# Patient Record
Sex: Male | Born: 1940 | Race: White | Hispanic: No | Marital: Married
Health system: Southern US, Community
[De-identification: ages and names within clinical notes are randomized; demographics above are authoritative.]

## PROBLEM LIST (undated history)

## (undated) DIAGNOSIS — R4702 Dysphasia: Secondary | ICD-10-CM

## (undated) DIAGNOSIS — I272 Pulmonary hypertension, unspecified: Secondary | ICD-10-CM

## (undated) DIAGNOSIS — J9601 Acute respiratory failure with hypoxia: Secondary | ICD-10-CM

## (undated) DIAGNOSIS — I639 Cerebral infarction, unspecified: Secondary | ICD-10-CM

## (undated) DIAGNOSIS — I2510 Atherosclerotic heart disease of native coronary artery without angina pectoris: Secondary | ICD-10-CM

## (undated) DIAGNOSIS — E119 Type 2 diabetes mellitus without complications: Secondary | ICD-10-CM

## (undated) DIAGNOSIS — D649 Anemia, unspecified: Secondary | ICD-10-CM

## (undated) DIAGNOSIS — I10 Essential (primary) hypertension: Secondary | ICD-10-CM

## (undated) DIAGNOSIS — I4891 Unspecified atrial fibrillation: Secondary | ICD-10-CM

## (undated) DIAGNOSIS — I739 Peripheral vascular disease, unspecified: Secondary | ICD-10-CM

## (undated) DIAGNOSIS — N289 Disorder of kidney and ureter, unspecified: Secondary | ICD-10-CM

## (undated) DIAGNOSIS — I509 Heart failure, unspecified: Secondary | ICD-10-CM

## (undated) HISTORY — PX: CORONARY ARTERY BYPASS GRAFT: SHX141

## (undated) HISTORY — PX: TRACHEOSTOMY: SUR1362

## (undated) HISTORY — PX: PEG PLACEMENT: SHX5437

---

## 2017-08-08 ENCOUNTER — Emergency Department (HOSPITAL_COMMUNITY): Payer: Medicare Other

## 2017-08-08 ENCOUNTER — Emergency Department (HOSPITAL_COMMUNITY)
Admission: EM | Admit: 2017-08-08 | Discharge: 2017-08-26 | Disposition: E | Payer: Medicare Other | Attending: Emergency Medicine | Admitting: Emergency Medicine

## 2017-08-08 DIAGNOSIS — I959 Hypotension, unspecified: Secondary | ICD-10-CM | POA: Diagnosis present

## 2017-08-08 DIAGNOSIS — A419 Sepsis, unspecified organism: Secondary | ICD-10-CM | POA: Diagnosis not present

## 2017-08-08 DIAGNOSIS — I10 Essential (primary) hypertension: Secondary | ICD-10-CM | POA: Insufficient documentation

## 2017-08-08 DIAGNOSIS — J189 Pneumonia, unspecified organism: Secondary | ICD-10-CM | POA: Diagnosis not present

## 2017-08-08 DIAGNOSIS — Z9911 Dependence on respirator [ventilator] status: Secondary | ICD-10-CM | POA: Diagnosis not present

## 2017-08-08 DIAGNOSIS — Z8673 Personal history of transient ischemic attack (TIA), and cerebral infarction without residual deficits: Secondary | ICD-10-CM | POA: Insufficient documentation

## 2017-08-08 DIAGNOSIS — E119 Type 2 diabetes mellitus without complications: Secondary | ICD-10-CM | POA: Diagnosis not present

## 2017-08-08 DIAGNOSIS — B999 Unspecified infectious disease: Secondary | ICD-10-CM

## 2017-08-08 HISTORY — DX: Dysphasia: R47.02

## 2017-08-08 HISTORY — DX: Anemia, unspecified: D64.9

## 2017-08-08 HISTORY — DX: Pulmonary hypertension, unspecified: I27.20

## 2017-08-08 HISTORY — DX: Atherosclerotic heart disease of native coronary artery without angina pectoris: I25.10

## 2017-08-08 HISTORY — DX: Type 2 diabetes mellitus without complications: E11.9

## 2017-08-08 HISTORY — DX: Disorder of kidney and ureter, unspecified: N28.9

## 2017-08-08 HISTORY — DX: Essential (primary) hypertension: I10

## 2017-08-08 HISTORY — DX: Heart failure, unspecified: I50.9

## 2017-08-08 HISTORY — DX: Acute respiratory failure with hypoxia: J96.01

## 2017-08-08 HISTORY — DX: Unspecified atrial fibrillation: I48.91

## 2017-08-08 HISTORY — DX: Peripheral vascular disease, unspecified: I73.9

## 2017-08-08 HISTORY — DX: Cerebral infarction, unspecified: I63.9

## 2017-08-08 LAB — CBG MONITORING, ED: GLUCOSE-CAPILLARY: 150 mg/dL — AB (ref 70–99)

## 2017-08-08 MED ORDER — SODIUM CHLORIDE 0.9 % IV BOLUS (SEPSIS): 1000.0000 mL | Freq: Once | INTRAVENOUS | Status: DC

## 2017-08-08 MED ORDER — SODIUM CHLORIDE 0.9 % IV BOLUS (SEPSIS)
1000.0000 mL | Freq: Once | INTRAVENOUS | Status: AC
  Administered 2017-08-08: 1000 mL via INTRAVENOUS

## 2017-08-08 MED ORDER — VANCOMYCIN HCL IN DEXTROSE 1-5 GM/200ML-% IV SOLN
1000.0000 mg | Freq: Once | INTRAVENOUS | Status: AC
  Administered 2017-08-09: 1000 mg via INTRAVENOUS
  Filled 2017-08-08: qty 200

## 2017-08-08 MED ORDER — SODIUM CHLORIDE 0.9 % IV BOLUS (SEPSIS)
1000.0000 mL | Freq: Once | INTRAVENOUS | Status: AC
Start: 2017-08-09 — End: 2017-08-09
  Administered 2017-08-09: 1000 mL via INTRAVENOUS

## 2017-08-08 MED ORDER — SODIUM CHLORIDE 0.9 % IV SOLN
2.0000 g | Freq: Once | INTRAVENOUS | Status: AC
  Administered 2017-08-09: 2 g via INTRAVENOUS
  Filled 2017-08-08: qty 2

## 2017-08-08 NOTE — ED Provider Notes (Addendum)
[facility] EMERGENCY DEPARTMENT Provider Note   CSN: [id number] Arrival date & time: 08/07/2017  2314     History   Chief Complaint Chief Complaint  Patient presents with  . Hypotension    HPI Adam Rivera is a 77 y.o. male.  The history is provided by the patient and medical records.     LEVEL V CAVEAT:  PATIENT NON-VERBAL  77 y.o. M with hx of DM, HTN, HLP, prior stroke, chronic vent dependency, PEG tube placement, PVD s/p partial right foot amputation in 07/28/17, presenting to the ED for hypotension and hypoglycemia.  Per [facility], patient transferred to [facility] a few months ago.  Today patient was found to be hypotensive in the 80's systolic and hypoglycemic with CBG in the 60's.  Patient was given juice via PEG tube, D5 NS started en route and CBG 150's on arrival here.  Patient has rhonchorous breath sounds.    No other history available at this time.  No past medical history on file.  There are no active problems to display for this patient.       Home Medications    Prior to Admission medications   Not on File    Family History No family history on file.  Social History Social History   Tobacco Use  . Smoking status: Not on file  Substance Use Topics  . Alcohol use: Not on file  . Drug use: Not on file     Allergies   Patient has no known allergies.   Review of Systems Review of Systems  Unable to perform ROS: Other     Physical Exam Updated Vital Signs BP (!) 53/40 (BP Location: Left Arm)   Pulse 66   Temp (!) 97.3 F (36.3 C) (Oral)   Resp 20   Wt 77.1 kg (170 lb)   SpO2 94%   Physical Exam  Constitutional: He appears well-developed and well-nourished.  Chronically ill appearing  HENT:  Head: Normocephalic and atraumatic.  Mouth/Throat: Oropharynx is clear and moist. Mucous membranes are dry.  Eyes: Pupils are equal, round, and reactive to light. Conjunctivae and EOM are normal.  Eyes intermittently  tracking  Neck: Normal range of motion.  Cardiovascular: Normal rate, regular rhythm and normal heart sounds.  Pulmonary/Chest: Effort normal. No stridor. No respiratory distress. He has rhonchi.  Vent dependent, normal rise and fall of chest, rhonchi throughout  Abdominal: Soft. Bowel sounds are normal. There is no tenderness. There is no rigidity and no guarding.  PEG in place  Musculoskeletal: Normal range of motion.  Right foot partially amputated, bandaged No significant peripheral edema  Neurological:  Awake, appears lethargic  Skin: Skin is warm and dry. No rash noted. There is pallor.  Psychiatric: He has a normal mood and affect.  Nursing note and vitals reviewed.    ED Treatments / Results  Labs (all labs ordered are listed, but only abnormal results are displayed) Labs Reviewed  COMPREHENSIVE METABOLIC PANEL - Abnormal; Notable for the following components:      Result Value   Sodium 129 (*)    Chloride 94 (*)    Glucose, Bld 225 (*)    BUN 55 (*)    Creatinine, Ser 1.52 (*)    Calcium 7.9 (*)    Total Protein 5.8 (*)    Albumin 1.6 (*)    GFR calc non Af Amer 42 (*)    GFR calc Af Amer 49 (*)    All other components within  normal limits  CBC WITH DIFFERENTIAL/PLATELET - Abnormal; Notable for the following components:   WBC 27.7 (*)    RBC 2.69 (*)    Hemoglobin 7.1 (*)    HCT 23.7 (*)    RDW 17.6 (*)    Neutro Abs 26.0 (*)    Lymphs Abs 0.6 (*)    All other components within normal limits  PROTIME-INR - Abnormal; Notable for the following components:   Prothrombin Time 18.2 (*)    All other components within normal limits  BRAIN NATRIURETIC PEPTIDE - Abnormal; Notable for the following components:   B Natriuretic Peptide 875.3 (*)    All other components within normal limits  CBG MONITORING, ED - Abnormal; Notable for the following components:   Glucose-Capillary 150 (*)    All other components within normal limits  I-STAT CG4 LACTIC ACID, ED -  Abnormal; Notable for the following components:   Lactic Acid, Venous 2.83 (*)    All other components within normal limits  CULTURE, BLOOD (ROUTINE X 2)  CULTURE, BLOOD (ROUTINE X 2)  URINE CULTURE  C DIFFICILE QUICK SCREEN W PCR REFLEX  GASTROINTESTINAL PANEL BY PCR, STOOL (REPLACES STOOL CULTURE)  URINALYSIS, ROUTINE W REFLEX MICROSCOPIC  URINALYSIS, COMPLETE (UACMP) WITH MICROSCOPIC  I-STAT CG4 LACTIC ACID, ED    EKG None  Radiology Dg Chest Port 1 View  Result Date: 07/30/2017 CLINICAL DATA:  Hypotension. Recent partial amputation of right foot. EXAM: PORTABLE CHEST 1 VIEW COMPARISON:  None. FINDINGS: Patient rotated minimally right. Tracheostomy appropriately positioned. Borderline cardiomegaly, accentuated by AP portable technique. Layering small right pleural effusion. Probable tiny left pleural effusion. No pneumothorax. Right greater than left interstitial and airspace disease is mid and lower lung predominant. IMPRESSION: Right greater than left interstitial and airspace disease with layering pleural effusions. This could represent interstitial edema and/or infection/aspiration. Electronically Signed   By: Jeronimo Greaves M.D.   On: 08/06/2017 23:57   Dg Foot 2 Views Right  Result Date: 2017-08-20 CLINICAL DATA:  Hypotension.  Right recent foot amputation. EXAM: RIGHT FOOT - 2 VIEW COMPARISON:  None. FINDINGS: The patient is status post amputation at the base of the second through fifth metatarsals with amputation of the first ray at the first TMT joint. Surgical margins are sharp without evidence of osteomyelitis. Vascular calcifications crossing the ankle joint in keeping with diabetes. No soft tissue emphysema. Calcaneal enthesopathy is noted along the plantar and dorsal aspect. IMPRESSION: No radiographic evidence of osteomyelitis. Soft tissue mass or soft tissue emphysema. Electronically Signed   By: Tollie Eth M.D.   On: 08-20-17 00:01    Procedures Procedures (including  critical care time)  CRITICAL CARE Performed by: Garlon Hatchet   Total critical care time: 45 minutes  Critical care time was exclusive of separately billable procedures and treating other patients.  Critical care was necessary to treat or prevent imminent or life-threatening deterioration.  Critical care was time spent personally by me on the following activities: development of treatment plan with patient and/or surrogate as well as nursing, discussions with consultants, evaluation of patient's response to treatment, examination of patient, obtaining history from patient or surrogate, ordering and performing treatments and interventions, ordering and review of laboratory studies, ordering and review of radiographic studies, pulse oximetry and re-evaluation of patient's condition.   Medications Ordered in ED Medications  sodium chloride 0.9 % bolus 1,000 mL (0 mLs Intravenous Stopped 20-Aug-2017 0121)    And  sodium chloride 0.9 % bolus 1,000 mL (1,000  mLs Intravenous New Bag/Given Mar 15, 2017 0020)    And  sodium chloride 0.9 % bolus 1,000 mL (has no administration in time range)  norepinephrine (LEVOPHED) 4mg  in D5W 250mL premix infusion (18 mcg/min Intravenous Rate/Dose Change Mar 15, 2017 0110)  ceFEPIme (MAXIPIME) 2 g in sodium chloride 0.9 % 100 mL IVPB (has no administration in time range)  vancomycin (VANCOCIN) IVPB 1000 mg/200 mL premix (has no administration in time range)  ceFEPIme (MAXIPIME) 2 g in sodium chloride 0.9 % 100 mL IVPB (0 g Intravenous Stopped Mar 15, 2017 0057)  vancomycin (VANCOCIN) IVPB 1000 mg/200 mL premix ( Intravenous Stopped Mar 15, 2017 0120)     Initial Impression / Assessment and Plan / ED Course  I have reviewed the triage vital signs and the nursing notes.  Pertinent labs & imaging results that were available during my care of the patient were reviewed by me and considered in my medical decision making (see chart for details).  77 year old male that is chronically  vent dependent here with hypotension and hypoglycemia.  Limited history available at this time.  [facility] from [facility], has been there for a few months.  He did have a recent partial amputation of right foot.  Patient is hypotensive on arrival here after fluids with EMS.  CBG has improved to 150.  He is awake but appears lethargic.  Sounds are junky with rhonchi throughout.  Patient in A. fib which is chronic.  Clinical suspicion for sepsis, likely pneumonia given his vent dependency.  Labs, chest x-ray pending.  IV fluid boluses continued.  12:21 AM Wife has now arrived, able to give additional history-- patient has been trach/vent dependent since Feb 2019.  He was undergoing cardiac cath and she reports "something went wrong" and he was never the same.  Reports they did fine for blockages at that time but was only able to clear 1 of them.  States he never really recovered and has been trach dependent ever since.  Was temporarily on dialysis, no longer needed.  States he has been at [facility] since May 2019 and they have tried to wean him off the vent several times but he usually ends up with some type of "infection" and has to go back on it.  States the longest he has been off of it thus far is 2 weeks.  States over the past 24 hours he was off the vent and seemed to be struggling to breathe and seemed to be confused" flailing around" in the bed as if trying to ask for help.  States he did develop a fever today.  He reportedly had a chest x-ray at the facility earlier this morning that was read as "normal".  At this point, patient has had 2.5L of fluid, BP still not improving and down into the 50's systolic.  Will start levophed.  Wife has been updated with his current condition.  He is to remain FULL CODE at this time.  Will likely need ICU admission.  1:20 AM Patient has 2 brief episode of bradycardia down into the 30's but responding to atropine.  Wife is considering comfort care.  Patient has  apparently coded about 5x at [facility] thus far, has had CPR done multiple times and suffered multiple injuries from this, notably broken ribs all along right side per wife.  She does not want CPR done again should his heart stop but is comfortable with pressors, antibiotics, and other medications/fluids at this time.  She voiced that she does understand this his outlook is very  poor.  Discussed with Dr. Arsenio Loader-- will have team come to ED to evaluate.  Called back to patient's room due to recurrent episode of bradycardia and hypotension.  Patient HR down into the 20's, BP 30's systolic. Patient is pale, extremities appear mottled, completely unresponsive.  No femoral or carotid pulse palpable.  Discussed with wife that death is imminent at this point as she did not want CPR performed.  She acknowledged understanding and chose to withdraw care.  Medications, fluids, monitors, and vent were all.  Time of death pronounced at 1:48 AM.   Attending physician, Dr. Judd Lien, at bedside during resuscitative efforts.  I have called his facility and spoken with Morrie Sheldon, RN and updated her.  She has paged the on call physician for call back as she reported to me she was not allowed to give out the physician's number for me to contact him directly.  3:56 AM Still no call back from patient's doctor.  RN has called nurse back several times, apparently no one can get in contact with patient's PCP/on call physician.  6:08 AM Still unable to get in touch with physician on call for facility.  RN has called again, asked for house supervisor.  Director of nursing at [facility] to be contacted given lack of response from on call physician.  Final Clinical Impressions(s) / ED Diagnoses   Final diagnoses:  Sepsis, due to unspecified organism [facility])  HCAP (healthcare-associated pneumonia)    ED Discharge Orders    None       Garlon Hatchet, PA-C 08/23/2017 0612    Garlon Hatchet, PA-C 07/29/2017 7253    Geoffery Lyons,  MD 08/03/2017 6644    Geoffery Lyons, MD 08/03/2017 [phone]

## 2017-08-08 NOTE — ED Triage Notes (Signed)
Pt transported from [facility] with reports of hypotension 80's systolic, CBG 60's, pt given juice through peg tube by staff, pt is vent dependent with 6.0 Portex trach, audible rhonchi. Recent partial amputation R foot.

## 2017-08-09 ENCOUNTER — Encounter (HOSPITAL_COMMUNITY): Payer: Self-pay | Admitting: Emergency Medicine

## 2017-08-09 DIAGNOSIS — A419 Sepsis, unspecified organism: Secondary | ICD-10-CM | POA: Diagnosis not present

## 2017-08-09 LAB — CBC WITH DIFFERENTIAL/PLATELET
BASOS PCT: 0 %
Basophils Absolute: 0 10*3/uL (ref 0.0–0.1)
Eosinophils Absolute: 0.3 10*3/uL (ref 0.0–0.7)
Eosinophils Relative: 1 %
HEMATOCRIT: 23.7 % — AB (ref 39.0–52.0)
Hemoglobin: 7.1 g/dL — ABNORMAL LOW (ref 13.0–17.0)
Lymphocytes Relative: 2 %
Lymphs Abs: 0.6 10*3/uL — ABNORMAL LOW (ref 0.7–4.0)
MCH: 26.4 pg (ref 26.0–34.0)
MCHC: 30 g/dL (ref 30.0–36.0)
MCV: 88.1 fL (ref 78.0–100.0)
MONO ABS: 0.8 10*3/uL (ref 0.1–1.0)
Monocytes Relative: 3 %
NEUTROS ABS: 26 10*3/uL — AB (ref 1.7–7.7)
NEUTROS PCT: 94 %
Platelets: 251 10*3/uL (ref 150–400)
RBC: 2.69 MIL/uL — ABNORMAL LOW (ref 4.22–5.81)
RDW: 17.6 % — AB (ref 11.5–15.5)
WBC: 27.7 10*3/uL — ABNORMAL HIGH (ref 4.0–10.5)

## 2017-08-09 LAB — COMPREHENSIVE METABOLIC PANEL
ALBUMIN: 1.6 g/dL — AB (ref 3.5–5.0)
ALT: 15 U/L (ref 0–44)
AST: 26 U/L (ref 15–41)
Alkaline Phosphatase: 91 U/L (ref 38–126)
Anion gap: 10 (ref 5–15)
BUN: 55 mg/dL — ABNORMAL HIGH (ref 8–23)
CHLORIDE: 94 mmol/L — AB (ref 98–111)
CO2: 25 mmol/L (ref 22–32)
CREATININE: 1.52 mg/dL — AB (ref 0.61–1.24)
Calcium: 7.9 mg/dL — ABNORMAL LOW (ref 8.9–10.3)
GFR calc Af Amer: 49 mL/min — ABNORMAL LOW (ref >60)
GFR calc non Af Amer: 42 mL/min — ABNORMAL LOW (ref >60)
GLUCOSE: 225 mg/dL — AB (ref 70–99)
Potassium: 4.3 mmol/L (ref 3.5–5.1)
Sodium: 129 mmol/L — ABNORMAL LOW (ref 135–145)
Total Bilirubin: 0.7 mg/dL (ref 0.3–1.2)
Total Protein: 5.8 g/dL — ABNORMAL LOW (ref 6.5–8.1)

## 2017-08-09 LAB — I-STAT CG4 LACTIC ACID, ED: Lactic Acid, Venous: 2.83 mmol/L (ref 0.5–1.9)

## 2017-08-09 LAB — BRAIN NATRIURETIC PEPTIDE: B Natriuretic Peptide: 875.3 pg/mL — ABNORMAL HIGH (ref 0.0–100.0)

## 2017-08-09 LAB — PROTIME-INR
INR: 1.53
PROTHROMBIN TIME: 18.2 s — AB (ref 11.4–15.2)

## 2017-08-09 LAB — CBG MONITORING, ED: Glucose-Capillary: 75 mg/dL (ref 70–99)

## 2017-08-09 MED ORDER — VANCOMYCIN HCL IN DEXTROSE 1-5 GM/200ML-% IV SOLN: 1000.0000 mg | INTRAVENOUS | Status: DC

## 2017-08-09 MED ORDER — NOREPINEPHRINE 4 MG/250ML-% IV SOLN
0.0000 ug/min | INTRAVENOUS | Status: DC
  Administered 2017-08-09: 2 ug/min via INTRAVENOUS
  Filled 2017-08-09: qty 250

## 2017-08-09 MED ORDER — SODIUM CHLORIDE 0.9 % IV SOLN: 2.0000 g | INTRAVENOUS | Status: DC

## 2017-08-09 MED FILL — Medication: Qty: 1 | Status: AC

## 2017-08-10 LAB — BLOOD CULTURE ID PANEL (REFLEXED)
Acinetobacter baumannii: NOT DETECTED
CANDIDA KRUSEI: NOT DETECTED
CANDIDA TROPICALIS: NOT DETECTED
Candida albicans: NOT DETECTED
Candida glabrata: NOT DETECTED
Candida parapsilosis: NOT DETECTED
ENTEROBACTER CLOACAE COMPLEX: NOT DETECTED
Enterobacteriaceae species: NOT DETECTED
Enterococcus species: NOT DETECTED
Escherichia coli: NOT DETECTED
Haemophilus influenzae: NOT DETECTED
KLEBSIELLA PNEUMONIAE: NOT DETECTED
Klebsiella oxytoca: NOT DETECTED
Listeria monocytogenes: NOT DETECTED
Methicillin resistance: NOT DETECTED
NEISSERIA MENINGITIDIS: NOT DETECTED
PROTEUS SPECIES: NOT DETECTED
Pseudomonas aeruginosa: NOT DETECTED
SERRATIA MARCESCENS: NOT DETECTED
STAPHYLOCOCCUS SPECIES: DETECTED — AB
STREPTOCOCCUS SPECIES: NOT DETECTED
Staphylococcus aureus (BCID): DETECTED — AB
Streptococcus agalactiae: NOT DETECTED
Streptococcus pneumoniae: NOT DETECTED
Streptococcus pyogenes: NOT DETECTED

## 2017-08-12 LAB — CULTURE, BLOOD (ROUTINE X 2): SPECIAL REQUESTS: ADEQUATE

## 2017-08-14 LAB — CULTURE, BLOOD (ROUTINE X 2): Culture: NO GROWTH

## 2017-08-26 NOTE — ED Notes (Signed)
No response from medication, no pulses, no respiratory effort over vent noted.

## 2017-08-26 NOTE — ED Notes (Signed)
+   pulses, ST, pt non responsive to stimuli

## 2017-08-26 NOTE — ED Notes (Signed)
MD at bedside speaking with patients wife, she confirms patient is a no CPR

## 2017-08-26 NOTE — ED Notes (Signed)
Pt's HR dropped to 40's, MD at bedside,  Atropine 1amp push given.  Pts wife at bedside.

## 2017-08-26 NOTE — ED Notes (Signed)
HR 30's no pulses detected. Atropine 1 amp push given. Pts wife at bedside.

## 2017-08-26 NOTE — ED Notes (Signed)
[facility] contacted again for MD contact, per Morrie SheldonAshley she has left message and text MD on call, Dr. Nickolas MadridSadat Khan with no response. She states she will continue to attempt to contact MD.

## 2017-08-26 NOTE — ED Notes (Signed)
This RN spoke with [facility] @ [facility], in re to no response from MD on call.  She states she has left several more messages with no response. I requested an administrator on call or DON be contacted at this time.

## 2017-08-26 NOTE — Progress Notes (Signed)
Chaplain informed of the patient's death while in ED, he was a transfer [facility]. He was a no code patient due to extensive medical issues. Chaplain presented to the patient"s wife to provide emotional, and spiritual care support, prayer was also offered.  She was informed of all needed information for the staff for the patient's final arrangement. [place], [place] after being at the bedside of her husband for the past five months during his illness.  Chaplain Janell QuietAudrey Birdena Kingma [phone]

## 2017-08-26 NOTE — ED Notes (Signed)
Dr. Judd Lienelo at bedside, no pulses noted, no respiratory effort over ventilator.  Pts wife again confirmed no CPR is to be performed at this time.  TOD 0148

## 2017-08-26 NOTE — ED Notes (Signed)
Pts HR noted to be steadily declining, MD called to bedside

## 2017-08-26 NOTE — ED Notes (Signed)
Chaplain at bedside

## 2017-08-26 NOTE — ED Notes (Signed)
Pt again became bradycardic, +pulses, MD at bedside, pts wife speaking with MD regarding code status. Atropine 1amp and Epi 1amp given IV push Pts wife states she does not want compressions performed if pts heart stops.

## 2017-08-26 NOTE — Progress Notes (Signed)
Pharmacy Antibiotic Note  Watt ClimesRichard Osley is a 77 y.o. male admitted on 08/14/2017 with sepsis.  Pharmacy has been consulted for vancomycin and cefepime dosing.  Plan: Vancomycin 1gm q24 Cefepime 2gm IV q24 hours F/u renal function, cultures and clinical course  Weight: 170 lb (77.1 kg)  Temp (24hrs), Avg:97.3 F (36.3 C), Min:97.3 F (36.3 C), Max:97.3 F (36.3 C)  Recent Labs  Lab 08/21/2017 2327 09-05-17 0025  WBC 27.7*  --   CREATININE 1.52*  --   LATICACIDVEN  --  2.83*    CrCl cannot be calculated (Unknown ideal weight.).    No Known Allergies   Thank you for allowing pharmacy to be a part of this patient's care.  Talbert CageSeay, Wendee Hata Poteet 05-07-2017 1:45 AM

## 2017-08-26 DEATH — deceased

## 2019-03-27 IMAGING — DX DG FOOT 2V*R*
2 series · 2 of 2 positions shown · non-contrast
Comparison: None.

CLINICAL DATA: Hypotension.  Right recent foot amputation.

EXAM:
RIGHT FOOT - 2 VIEW

[foot ap]
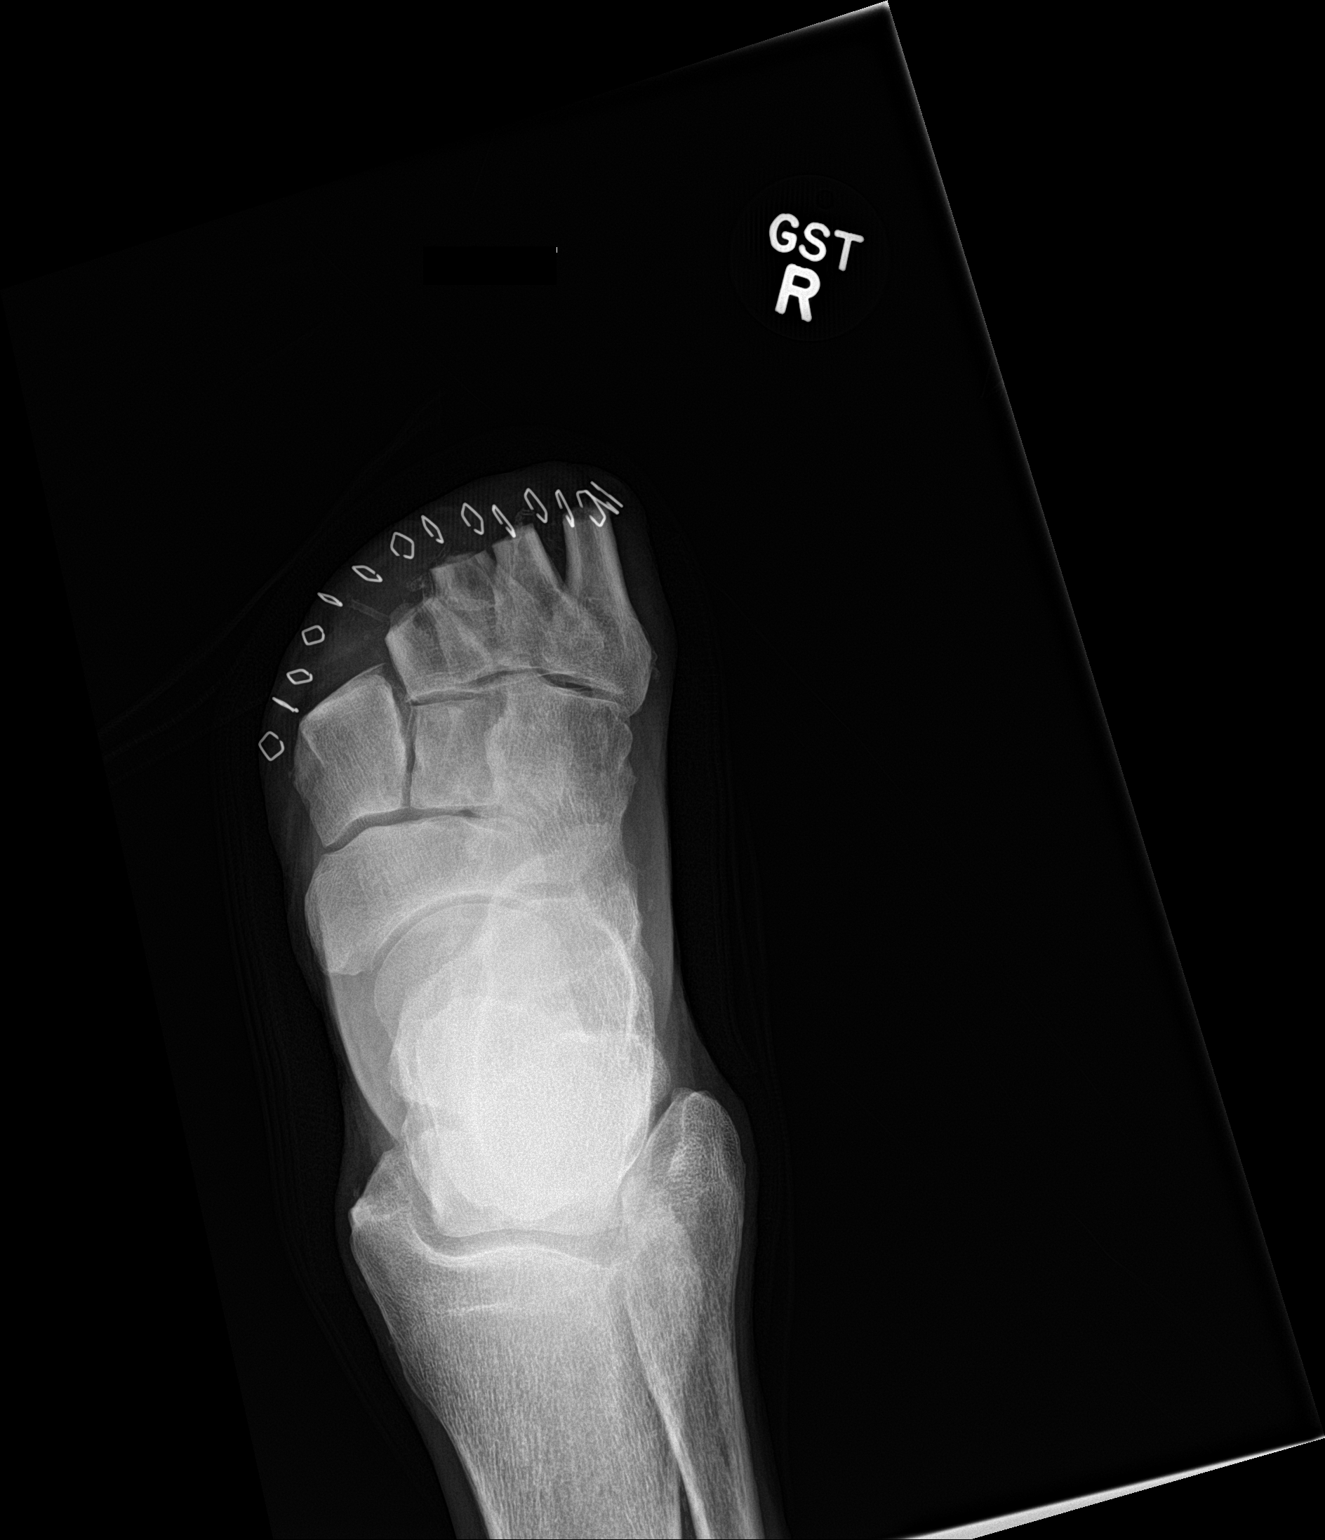

[foot lat]
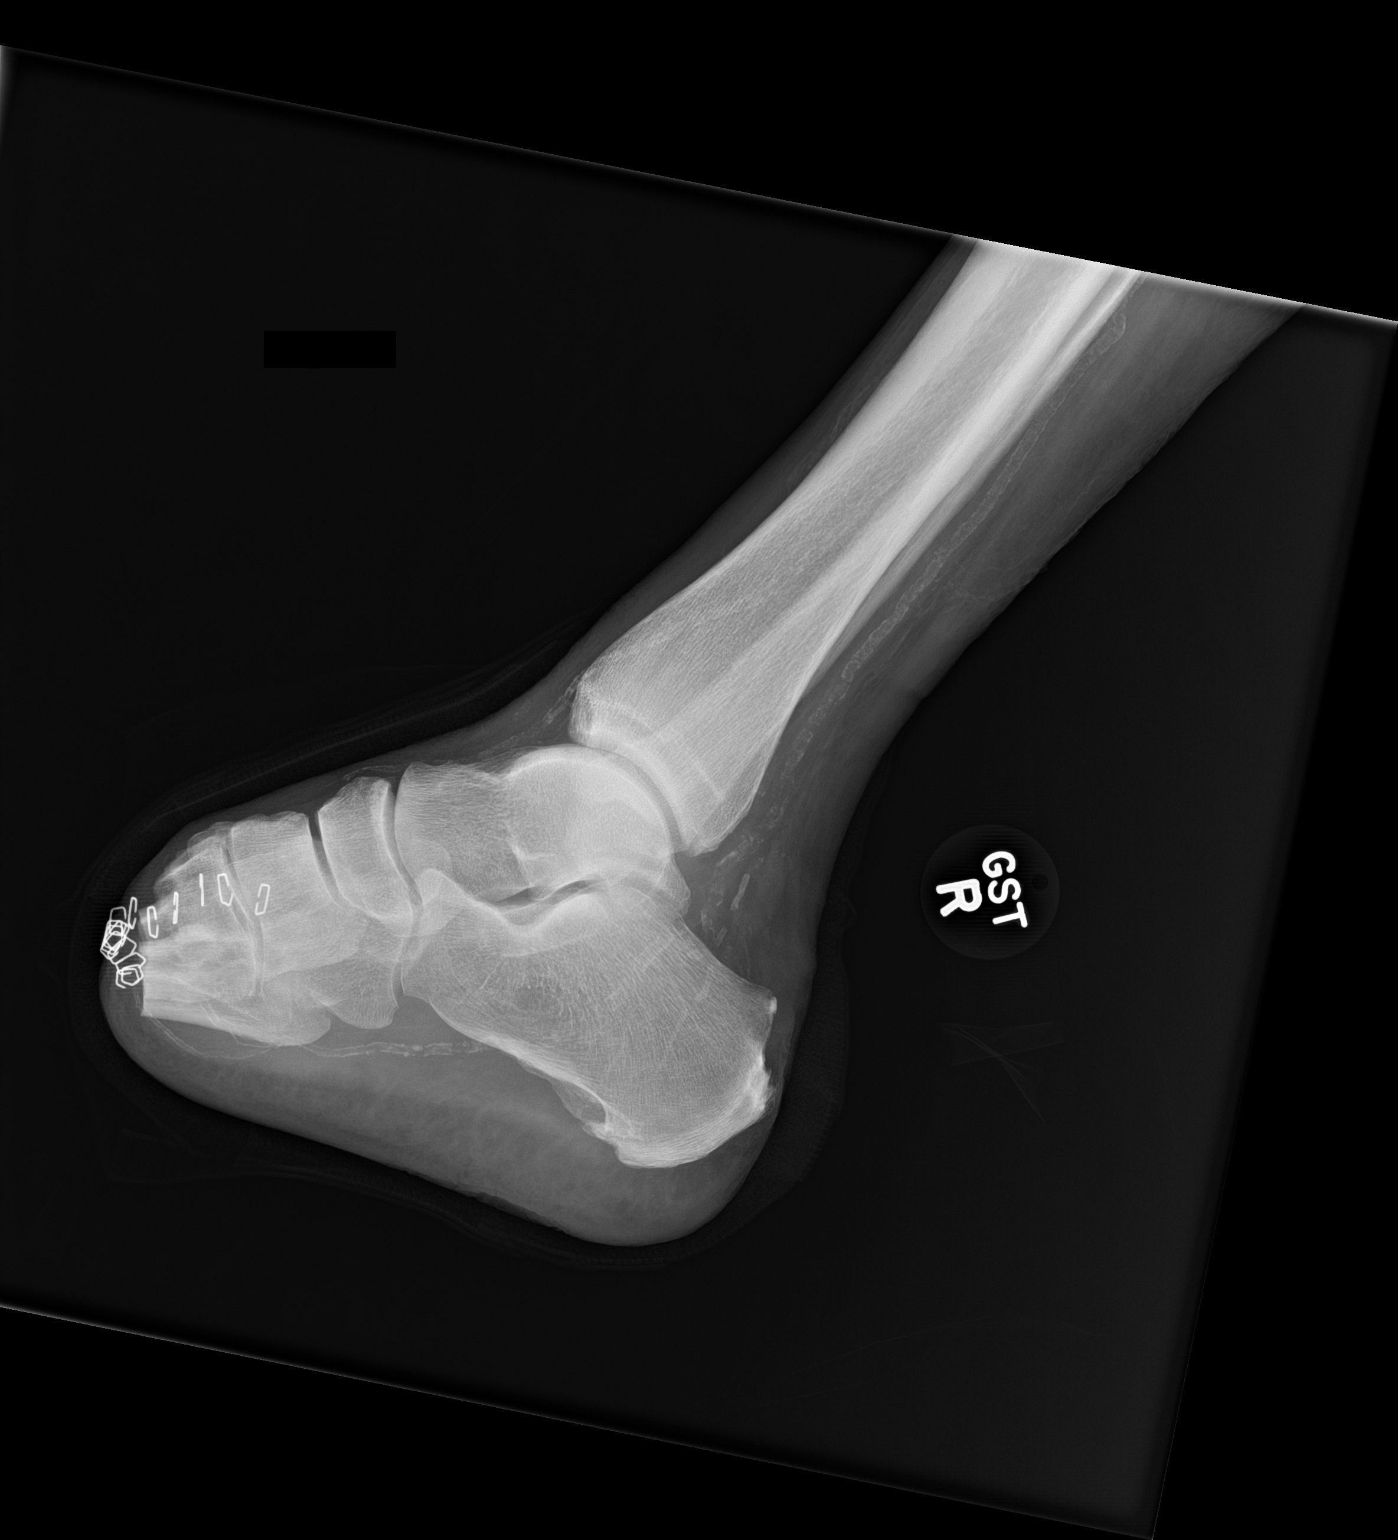

[2 of 2 positions shown; findings below may reference images not displayed]

FINDINGS: The patient is status post amputation at the base of the second
through fifth metatarsals with amputation of the first ray at the
first TMT joint. Surgical margins are sharp without evidence of
osteomyelitis. Vascular calcifications crossing the ankle joint in
keeping with diabetes. No soft tissue emphysema. Calcaneal
enthesopathy is noted along the plantar and dorsal aspect.
IMPRESSION: No radiographic evidence of osteomyelitis. Soft tissue mass or soft
tissue emphysema.
